# Patient Record
Sex: Male | Born: 1990 | Hispanic: Yes | Marital: Single | Smoking: Never smoker
Health system: Southern US, Community
[De-identification: ages and names within clinical notes are randomized; demographics above are authoritative.]

## PROBLEM LIST (undated history)

## (undated) DIAGNOSIS — S61219A Laceration without foreign body of unspecified finger without damage to nail, initial encounter: Secondary | ICD-10-CM

## (undated) DIAGNOSIS — Z8709 Personal history of other diseases of the respiratory system: Secondary | ICD-10-CM

## (undated) HISTORY — PX: NO PAST SURGERIES: SHX2092

---
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
## 2010-02-16 ENCOUNTER — Emergency Department (HOSPITAL_COMMUNITY): Admission: EM | Admit: 2010-02-16 | Discharge: 2010-02-16 | Payer: Self-pay | Admitting: Emergency Medicine

## 2012-09-30 ENCOUNTER — Encounter (HOSPITAL_COMMUNITY): Payer: Self-pay | Admitting: Emergency Medicine

## 2012-09-30 ENCOUNTER — Emergency Department (HOSPITAL_COMMUNITY)
Admission: EM | Admit: 2012-09-30 | Discharge: 2012-09-30 | Disposition: A | Discharge status: Home / Self Care | Payer: Self-pay | Attending: Emergency Medicine | Admitting: Emergency Medicine

## 2012-09-30 ENCOUNTER — Emergency Department (HOSPITAL_COMMUNITY): Payer: Self-pay

## 2012-09-30 DIAGNOSIS — S62339A Displaced fracture of neck of unspecified metacarpal bone, initial encounter for closed fracture: Secondary | ICD-10-CM

## 2012-09-30 DIAGNOSIS — Y929 Unspecified place or not applicable: Secondary | ICD-10-CM | POA: Insufficient documentation

## 2012-09-30 DIAGNOSIS — IMO0002 Reserved for concepts with insufficient information to code with codable children: Secondary | ICD-10-CM | POA: Insufficient documentation

## 2012-09-30 DIAGNOSIS — Y9389 Activity, other specified: Secondary | ICD-10-CM | POA: Insufficient documentation

## 2012-09-30 DIAGNOSIS — Z79899 Other long term (current) drug therapy: Secondary | ICD-10-CM | POA: Insufficient documentation

## 2012-09-30 DIAGNOSIS — S62309A Unspecified fracture of unspecified metacarpal bone, initial encounter for closed fracture: Secondary | ICD-10-CM | POA: Insufficient documentation

## 2012-09-30 DIAGNOSIS — Z7982 Long term (current) use of aspirin: Secondary | ICD-10-CM | POA: Insufficient documentation

## 2012-09-30 MED ORDER — HYDROCODONE-ACETAMINOPHEN 5-325 MG PO TABS: 1.0000 | ORAL_TABLET | Freq: Four times a day (QID) | ORAL | Status: DC | PRN

## 2012-09-30 NOTE — ED Provider Notes (Signed)
History     CSN: [id number]  Arrival date & time 09/30/12  1121   First MD Initiated Contact with Patient 09/30/12 1144      Chief Complaint  Patient presents with  . Hand Pain    (Consider location/radiation/quality/duration/timing/severity/associated sxs/prior treatment) HPI Comments: Patient presenting with right hand pain.  Developed pain after punching a wall three days ago.  He is currently having some pain and swelling over the 5th metacarpal near the 5th MCP.  No prior injury to the area.    Patient is a 21 y.o. male presenting with hand injury. The history is provided by the patient.  Hand Injury Location:  Hand Hand location:  L hand and R hand Pain details:    Quality:  Aching   Radiates to:  Does not radiate   Timing:  Constant Handedness:  Right-handed Foreign body present:  No foreign bodies Prior injury to area:  No Ineffective treatments:  None tried Associated symptoms: swelling   Associated symptoms: no numbness, no stiffness and no tingling     History reviewed. No pertinent past medical history.  History reviewed. No pertinent past surgical history.  History reviewed. No pertinent family history.  History  Substance Use Topics  . Smoking status: Not on file  . Smokeless tobacco: Not on file  . Alcohol Use: No      Review of Systems  Musculoskeletal: Negative for stiffness.       Right hand pain and swelling  Skin: Negative for color change.  All other systems reviewed and are negative.    Allergies  Shellfish allergy  Home Medications   Current Outpatient Rx  Name  Route  Sig  Dispense  Refill  . albuterol (PROVENTIL HFA;VENTOLIN HFA) 108 (90 BASE) MCG/ACT inhaler   Inhalation   Inhale 2 puffs into the lungs every 6 (six) hours as needed for wheezing or shortness of breath.         . Aspirin-Salicylamide-Caffeine (ARTHRITIS STRENGTH BC POWDER PO)   Oral   Take by mouth.         Marland Kitchen HYDROcodone-acetaminophen (NORCO) 5-325 MG  per tablet   Oral   Take 1 tablet by mouth every 6 (six) hours as needed for pain.   15 tablet   0     BP 140/85  Pulse 61  Temp(Src) 98.6 F (37 C) (Oral)  SpO2 100%  Physical Exam  Nursing note and vitals reviewed. Constitutional: He appears well-developed and well-nourished. No distress.  HENT:  Head: Normocephalic and atraumatic.  Neck: Normal range of motion. Neck supple.  Cardiovascular: Normal rate, regular rhythm and normal heart sounds.   Pulmonary/Chest: Effort normal and breath sounds normal.  Musculoskeletal:       Right wrist: He exhibits normal range of motion, no tenderness, no bony tenderness, no swelling and no deformity.  Tenderness to palpation and swelling over the 5th metacarpal of the right hand.   Patient with flexion and extension of the right 5th digit mildly decreased Full ROM of the 1st-4th fingers of the left hand.    Neurological: He is alert. No sensory deficit.  Skin: Skin is warm and dry. No ecchymosis and no laceration noted. He is not diaphoretic. No erythema.  Good capillary refill of the fingers of the left hand  Psychiatric: He has a normal mood and affect.    ED Course  Procedures (including critical care time)  Labs Reviewed - No data to display Dg Hand Complete Right  09/30/2012  *  RADIOLOGY REPORT*  Clinical Data: Pain and swelling post injury  RIGHT HAND - COMPLETE 3+ VIEW  Comparison: None.  Findings: Three views of the right hand submitted.  Mild displaced fracture of the distal fifth metacarpal.  IMPRESSION: Mild displaced fracture distal fifth metacarpal.   Original Report Authenticated By: Natasha Mead, M.D.      1. Boxer's fracture, closed, initial encounter       MDM  Patient with a mildly displaced closed boxer's fracture.  Patient neurovascularly intact.  Patient given Ulnar gutter splint and referral to Orthopedics.        Pascal Lux [place], PA-C 09/30/12 [phone]

## 2012-09-30 NOTE — ED Notes (Addendum)
Pt states that he got mad on Tuesday and punched a wall.  C/o rt hand pain in his pinky finger.  States that the swelling has gone down but it doesn't feel right.  Denies pain unless he puts his hand down, it throbs.  Pt is able to move all fingers.  Pt made a homemade bandage out of ace wrap and popcicle sticks.

## 2012-10-01 NOTE — ED Provider Notes (Signed)
Medical screening examination/treatment/procedure(s) were performed by non-physician practitioner and as supervising physician I was immediately available for consultation/collaboration.  Christopher J. Pollina, MD 10/01/12 0752 

## 2014-05-29 ENCOUNTER — Emergency Department (HOSPITAL_COMMUNITY): Payer: Self-pay

## 2014-05-29 ENCOUNTER — Encounter (HOSPITAL_COMMUNITY): Payer: Self-pay | Admitting: Emergency Medicine

## 2014-05-29 ENCOUNTER — Emergency Department (HOSPITAL_COMMUNITY)
Admission: EM | Admit: 2014-05-29 | Discharge: 2014-05-30 | Disposition: A | Discharge status: Home / Self Care | Payer: Self-pay | Attending: Emergency Medicine | Admitting: Emergency Medicine

## 2014-05-29 DIAGNOSIS — S66802A Unspecified injury of other specified muscles, fascia and tendons at wrist and hand level, left hand, initial encounter: Secondary | ICD-10-CM

## 2014-05-29 DIAGNOSIS — Z23 Encounter for immunization: Secondary | ICD-10-CM | POA: Insufficient documentation

## 2014-05-29 DIAGNOSIS — Z79899 Other long term (current) drug therapy: Secondary | ICD-10-CM | POA: Insufficient documentation

## 2014-05-29 DIAGNOSIS — S61219A Laceration without foreign body of unspecified finger without damage to nail, initial encounter: Secondary | ICD-10-CM

## 2014-05-29 DIAGNOSIS — Y9289 Other specified places as the place of occurrence of the external cause: Secondary | ICD-10-CM | POA: Insufficient documentation

## 2014-05-29 DIAGNOSIS — Y9389 Activity, other specified: Secondary | ICD-10-CM | POA: Insufficient documentation

## 2014-05-29 DIAGNOSIS — W274XXA Contact with kitchen utensil, initial encounter: Secondary | ICD-10-CM | POA: Insufficient documentation

## 2014-05-29 DIAGNOSIS — S66103A Unspecified injury of flexor muscle, fascia and tendon of left middle finger at wrist and hand level, initial encounter: Secondary | ICD-10-CM | POA: Insufficient documentation

## 2014-05-29 DIAGNOSIS — S61213A Laceration without foreign body of left middle finger without damage to nail, initial encounter: Secondary | ICD-10-CM | POA: Insufficient documentation

## 2014-05-29 MED ORDER — TRAMADOL HCL 50 MG PO TABS
50.0000 mg | ORAL_TABLET | Freq: Once | ORAL | Status: AC
  Administered 2014-05-30: 50 mg via ORAL
  Filled 2014-05-29: qty 1

## 2014-05-29 MED ORDER — TETANUS-DIPHTH-ACELL PERTUSSIS 5-2.5-18.5 LF-MCG/0.5 IM SUSP
0.5000 mL | Freq: Once | INTRAMUSCULAR | Status: AC
  Administered 2014-05-30: 0.5 mL via INTRAMUSCULAR
  Filled 2014-05-29: qty 0.5

## 2014-05-29 NOTE — ED Notes (Signed)
Pt presents with laceration to anterior L middle finger, pt states laceration occurred while washing dishes 30 min ago. Not bleeding at this time.

## 2014-05-29 NOTE — ED Provider Notes (Signed)
CSN: [id number]     Arrival date & time 05/29/14  2316 History  This chart was scribed for non-physician practitioner, Antony MaduraKelly Azarya Oconnell, PA-C working with Dione Boozeavid Glick, MD by Luisa DagoPriscilla Tutu, ED scribe. This patient was seen in room [facility] and the patient's care was started at 11:36 PM.     Chief Complaint  Patient presents with  . Laceration   Patient is a 23 y.o. male presenting with skin laceration.  Laceration Location:  Finger Finger laceration location:  L middle finger Depth:  Through muscle Bleeding: controlled   Time since incident:  30 minutes Laceration mechanism:  Blunt object Pain details:    Quality:  Aching   Severity:  Moderate   Timing:  Constant   Progression:  Unchanged Foreign body present:  No foreign bodies Relieved by:  Nothing Worsened by:  Movement Ineffective treatments:  None tried Tetanus status:  Unknown  HPI Comments: Eric [name] is a 23 y.o. male who presents to the Emergency Department complaining of laceration to anterior left middle finger that occurred just PTA. Pt states that he was washing dishes when he felt a sharp cutting sensation to the affected finger so he took his hand out of the water. Mr. Eric [name] states that he is unable to move his finger. Bleeding is controlled. Pt is right hand dominant. He does not recall the date of his last tetanus vaccine.   History reviewed. No pertinent past medical history. History reviewed. No pertinent past surgical history. No family history on file. History  Substance Use Topics  . Smoking status: Never Smoker   . Smokeless tobacco: Not on file  . Alcohol Use: No    Review of Systems  Skin: Positive for wound.  Neurological: Negative for weakness.  All other systems reviewed and are negative.   Allergies  Shellfish allergy  Home Medications   Prior to Admission medications   Medication Sig Start Date End Date Taking? Authorizing Provider  ibuprofen (ADVIL,MOTRIN) 200 MG tablet Take 600  mg by mouth every 6 (six) hours as needed for moderate pain.   Yes Historical Provider, MD  albuterol (PROVENTIL HFA;VENTOLIN HFA) 108 (90 BASE) MCG/ACT inhaler Inhale 2 puffs into the lungs every 6 (six) hours as needed for wheezing or shortness of breath.    Historical Provider, MD  cephALEXin (KEFLEX) 500 MG capsule Take 1 capsule (500 mg total) by mouth 4 (four) times daily. 05/30/14   Antony MaduraKelly Caliyah Sieh, PA-C  ibuprofen (ADVIL,MOTRIN) 600 MG tablet Take 1 tablet (600 mg total) by mouth every 6 (six) hours as needed. 05/30/14   Antony MaduraKelly Shameca Landen, PA-C   Triage Vitals:BP 121/66  Pulse 91  Resp 20  Ht 5\' 10"  (1.778 m)  Wt 135 lb (61.236 kg)  BMI 19.37 kg/m2  SpO2 100%  Physical Exam  Nursing note and vitals reviewed. Constitutional: He is oriented to person, place, and time. He appears well-developed and well-nourished. No distress.  HENT:  Head: Normocephalic and atraumatic.  Eyes: Conjunctivae and EOM are normal. No scleral icterus.  Neck: Normal range of motion.  Cardiovascular: Normal rate, regular rhythm and intact distal pulses.   Distal radial pulse 2+ in LUE. Capillary refill brisk in all digits of L hand.  Pulmonary/Chest: Effort normal. No respiratory distress.  Musculoskeletal: He exhibits tenderness.       Left hand: He exhibits decreased range of motion, tenderness and laceration. He exhibits no bony tenderness, normal capillary refill and no deformity. Normal sensation noted.       Hands:  1.5 cm laceration to palmar aspect of L 3rd finger just distal to PIP joint. Patient unable to flex at DIP of affected finger c/w likely FDP laceration. FDS appreciated to be 5/5 against resistance. Finger extension intact.   Neurological: He is alert and oriented to person, place, and time. He exhibits normal muscle tone. Coordination normal.  Sensation to light touch intact.  Skin: Skin is warm and dry. No rash noted. He is not diaphoretic. No erythema. No pallor.  Psychiatric: He has a normal  mood and affect. His behavior is normal.    ED Course  Procedures (including critical care time)  DIAGNOSTIC STUDIES: Oxygen Saturation is 100% on RA, normal by my interpretation.    COORDINATION OF CARE: 3:40 AM- Pt advised of plan for treatment and pt agrees.  Labs Review Labs Reviewed - No data to display  Imaging Review Dg Finger Middle Left  05/30/2014   CLINICAL DATA:  Laceration to the palm are side of the left middle finger at the PIP. Cut while doing dishes.  EXAM: LEFT MIDDLE FINGER 2+V  COMPARISON:  None.  FINDINGS: No evidence of acute fracture or dislocation of the left third finger. No focal bone lesions or bone destruction. No radiopaque soft tissue foreign bodies.  IMPRESSION: Negative.   Electronically Signed   By: Burman NievesWilliam  Stevens M.D.   On: 05/30/2014 00:00    EKG Interpretation None     LACERATION REPAIR Performed by: Antony MaduraHUMES, Cabrina Shiroma Authorized by: Antony MaduraHUMES, Sully Manzi Consent: Verbal consent obtained. Risks and benefits: risks, benefits and alternatives were discussed Consent given by: patient Patient identity confirmed: provided demographic data Prepped and Draped in normal sterile fashion Wound explored  Laceration Location: L middle finger  Laceration Length: 1.5cm  No Foreign Bodies seen or palpated  Anesthesia: local infiltration  Local anesthetic: lidocaine 1% without epinephrine  Anesthetic total: 1.5 ml  Irrigation method: syringe Amount of cleaning: standard  Skin closure: 5-0 prolene  Number of sutures: 3  Technique: simple interrupted  Patient tolerance: Patient tolerated the procedure well with no immediate complications.  MDM   Final diagnoses:  Laceration of finger, left, with tendon, initial encounter  Injury of flexor tendon of hand, left, initial encounter    23 year old male presents to the emergency department for further evaluation of finger laceration of palmar aspect of left third finger. Patient neurovascularly intact,  but unable to flex at his DIP joint. Imaging today shows no evidence of fracture. Physical exam suggestive of laceration of FDP tendon of L 3rd finger. Wound closed in ED and patient placed in finger splint. Case discussed with Dr. Mina MarbleWeingold who will followup with the patient in the office for repair. Patient told to call the office in the morning to schedule followup appointment. Will initiate Keflex to cover for infection. Return precautions discussed and provided. Patient agreeable to plan with no unaddressed concerns.  I personally performed the services described in this documentation, which was scribed in my presence. The recorded information has been reviewed and is accurate.   Filed Vitals:   05/29/14 2322 05/30/14 0216  BP: 121/66 115/61  Pulse: 91 61  Resp: 20 16  Height: 5\' 10"  (1.778 m)   Weight: 161135 lb (61.236 kg)   SpO2: 100% 100%       Antony MaduraKelly Starla Deller, PA-C 05/30/14 [phone]

## 2014-05-30 ENCOUNTER — Encounter (HOSPITAL_BASED_OUTPATIENT_CLINIC_OR_DEPARTMENT_OTHER): Payer: Self-pay | Admitting: *Deleted

## 2014-05-30 ENCOUNTER — Other Ambulatory Visit: Payer: Self-pay | Admitting: Orthopedic Surgery

## 2014-05-30 DIAGNOSIS — S61219A Laceration without foreign body of unspecified finger without damage to nail, initial encounter: Secondary | ICD-10-CM

## 2014-05-30 HISTORY — DX: Laceration without foreign body of unspecified finger without damage to nail, initial encounter: S61.219A

## 2014-05-30 MED ORDER — IBUPROFEN 600 MG PO TABS: 600.0000 mg | ORAL_TABLET | Freq: Four times a day (QID) | ORAL | Status: DC | PRN

## 2014-05-30 MED ORDER — LIDOCAINE HCL 1 % IJ SOLN
5.0000 mL | Freq: Once | INTRAMUSCULAR | Status: AC
  Administered 2014-05-30: 5 mL
  Filled 2014-05-30: qty 20

## 2014-05-30 MED ORDER — CEPHALEXIN 500 MG PO CAPS
500.0000 mg | ORAL_CAPSULE | Freq: Four times a day (QID) | ORAL | Status: DC
Start: 2014-05-30 — End: 2014-05-30

## 2014-05-30 NOTE — ED Provider Notes (Signed)
Medical screening examination/treatment/procedure(s) were performed by non-physician practitioner and as supervising physician I was immediately available for consultation/collaboration.   Saadiq Poche, MD 05/30/14 0815 

## 2014-05-30 NOTE — Discharge Instructions (Signed)
Call the hand specialist in the morning to schedule follow up as a tendon in your finger is likely cut from your injury. Wear a finger splint until further instructed by a hand specialist. Take Keflex to prevent infection and ibuprofen for pain as needed.  Laceration Care, Adult A laceration is a cut or lesion that goes through all layers of the skin and into the tissue just beneath the skin. TREATMENT  Some lacerations may not require closure. Some lacerations may not be able to be closed due to an increased risk of infection. It is important to see your caregiver as soon as possible after an injury to minimize the risk of infection and maximize the opportunity for successful closure. If closure is appropriate, pain medicines may be given, if needed. The wound will be cleaned to help prevent infection. Your caregiver will use stitches (sutures), staples, wound glue (adhesive), or skin adhesive strips to repair the laceration. These tools bring the skin edges together to allow for faster healing and a better cosmetic outcome. However, all wounds will heal with a scar. Once the wound has healed, scarring can be minimized by covering the wound with sunscreen during the day for 1 full year. HOME CARE INSTRUCTIONS  For sutures or staples:  Keep the wound clean and dry.  If you were given a bandage (dressing), you should change it at least once a day. Also, change the dressing if it becomes wet or dirty, or as directed by your caregiver.  Wash the wound with soap and water 2 times a day. Rinse the wound off with water to remove all soap. Pat the wound dry with a clean towel.  After cleaning, apply a thin layer of the antibiotic ointment as recommended by your caregiver. This will help prevent infection and keep the dressing from sticking.  You may shower as usual after the first 24 hours. Do not soak the wound in water until the sutures are removed.  Only take over-the-counter or prescription  medicines for pain, discomfort, or fever as directed by your caregiver.  Get your sutures or staples removed as directed by your caregiver. For skin adhesive strips:  Keep the wound clean and dry.  Do not get the skin adhesive strips wet. You may bathe carefully, using caution to keep the wound dry.  If the wound gets wet, pat it dry with a clean towel.  Skin adhesive strips will fall off on their own. You may trim the strips as the wound heals. Do not remove skin adhesive strips that are still stuck to the wound. They will fall off in time. For wound adhesive:  You may briefly wet your wound in the shower or bath. Do not soak or scrub the wound. Do not swim. Avoid periods of heavy perspiration until the skin adhesive has fallen off on its own. After showering or bathing, gently pat the wound dry with a clean towel.  Do not apply liquid medicine, cream medicine, or ointment medicine to your wound while the skin adhesive is in place. This may loosen the film before your wound is healed.  If a dressing is placed over the wound, be careful not to apply tape directly over the skin adhesive. This may cause the adhesive to be pulled off before the wound is healed.  Avoid prolonged exposure to sunlight or tanning lamps while the skin adhesive is in place. Exposure to ultraviolet light in the first year will darken the scar.  The skin adhesive will usually  remain in place for 5 to 10 days, then naturally fall off the skin. Do not pick at the adhesive film. °You may need a tetanus shot if: °· You cannot remember when you had your last tetanus shot. °· You have never had a tetanus shot. °If you get a tetanus shot, your arm may swell, get red, and feel warm to the touch. This is common and not a problem. If you need a tetanus shot and you choose not to have one, there is a rare chance of getting tetanus. Sickness from tetanus can be serious. °SEEK MEDICAL CARE IF:  °· You have redness, swelling, or  increasing pain in the wound. °· You see a red line that goes away from the wound. °· You have yellowish-white fluid (pus) coming from the wound. °· You have a fever. °· You notice a bad smell coming from the wound or dressing. °· Your wound breaks open before or after sutures have been removed. °· You notice something coming out of the wound such as wood or glass. °· Your wound is on your hand or foot and you cannot move a finger or toe. °SEEK IMMEDIATE MEDICAL CARE IF:  °· Your pain is not controlled with prescribed medicine. °· You have severe swelling around the wound causing pain and numbness or a change in color in your arm, hand, leg, or foot. °· Your wound splits open and starts bleeding. °· You have worsening numbness, weakness, or loss of function of any joint around or beyond the wound. °· You develop painful lumps near the wound or on the skin anywhere on your body. °MAKE SURE YOU:  °· Understand these instructions. °· Will watch your condition. °· Will get help right away if you are not doing well or get worse. °Document Released: 07/26/2005 Document Revised: 10/18/2011 Document Reviewed: 01/19/2011 °Exit[employer]® Patient Information ©2015 [employer]. This information is not intended to replace advice given to you by your health care provider. Make sure you discuss any questions you have with your health care provider. ° °

## 2014-05-31 ENCOUNTER — Encounter (HOSPITAL_BASED_OUTPATIENT_CLINIC_OR_DEPARTMENT_OTHER): Payer: Self-pay | Admitting: Anesthesiology

## 2014-05-31 ENCOUNTER — Ambulatory Visit (HOSPITAL_BASED_OUTPATIENT_CLINIC_OR_DEPARTMENT_OTHER)
Admission: RE | Admit: 2014-05-31 | Discharge: 2014-05-31 | Disposition: A | Discharge status: Home / Self Care | Payer: Self-pay | Source: Ambulatory Visit | Attending: Orthopedic Surgery | Admitting: Orthopedic Surgery

## 2014-05-31 ENCOUNTER — Ambulatory Visit (HOSPITAL_BASED_OUTPATIENT_CLINIC_OR_DEPARTMENT_OTHER): Payer: Self-pay | Admitting: Anesthesiology

## 2014-05-31 ENCOUNTER — Encounter (HOSPITAL_BASED_OUTPATIENT_CLINIC_OR_DEPARTMENT_OTHER)
Admission: RE | Disposition: A | Discharge status: Home / Self Care | Payer: Self-pay | Source: Ambulatory Visit | Attending: Orthopedic Surgery

## 2014-05-31 DIAGNOSIS — Z91013 Allergy to seafood: Secondary | ICD-10-CM | POA: Insufficient documentation

## 2014-05-31 DIAGNOSIS — S66822D Laceration of other specified muscles, fascia and tendons at wrist and hand level, left hand, subsequent encounter: Secondary | ICD-10-CM

## 2014-05-31 DIAGNOSIS — Y998 Other external cause status: Secondary | ICD-10-CM | POA: Insufficient documentation

## 2014-05-31 DIAGNOSIS — Y93G1 Activity, food preparation and clean up: Secondary | ICD-10-CM | POA: Insufficient documentation

## 2014-05-31 DIAGNOSIS — W25XXXA Contact with sharp glass, initial encounter: Secondary | ICD-10-CM | POA: Insufficient documentation

## 2014-05-31 DIAGNOSIS — S66123A Laceration of flexor muscle, fascia and tendon of left middle finger at wrist and hand level, initial encounter: Secondary | ICD-10-CM | POA: Insufficient documentation

## 2014-05-31 DIAGNOSIS — S61402D Unspecified open wound of left hand, subsequent encounter: Secondary | ICD-10-CM

## 2014-05-31 DIAGNOSIS — Y92090 Kitchen in other non-institutional residence as the place of occurrence of the external cause: Secondary | ICD-10-CM | POA: Insufficient documentation

## 2014-05-31 HISTORY — PX: ARTERY EXPLORATION: SHX5110

## 2014-05-31 HISTORY — DX: Personal history of other diseases of the respiratory system: Z87.09

## 2014-05-31 HISTORY — DX: Laceration without foreign body of unspecified finger without damage to nail, initial encounter: S61.219A

## 2014-05-31 HISTORY — PX: TENDON REPAIR: SHX5111

## 2014-05-31 HISTORY — PX: NERVE EXPLORATION: SHX2082

## 2014-05-31 LAB — POCT HEMOGLOBIN-HEMACUE: Hemoglobin: 12.5 g/dL — ABNORMAL LOW (ref 13.0–17.0)

## 2014-05-31 SURGERY — TENDON REPAIR
Anesthesia: General | Site: Finger | Laterality: Left

## 2014-05-31 MED ORDER — ONDANSETRON HCL 4 MG/2ML IJ SOLN: 4.0000 mg | Freq: Once | INTRAMUSCULAR | Status: DC | PRN

## 2014-05-31 MED ORDER — FENTANYL CITRATE 0.05 MG/ML IJ SOLN: 50.0000 ug | INTRAMUSCULAR | Status: DC | PRN

## 2014-05-31 MED ORDER — OXYCODONE HCL 5 MG PO TABS: 5.0000 mg | ORAL_TABLET | Freq: Once | ORAL | Status: DC | PRN

## 2014-05-31 MED ORDER — DEXAMETHASONE SODIUM PHOSPHATE 10 MG/ML IJ SOLN
INTRAMUSCULAR | Status: DC | PRN
  Administered 2014-05-31: 10 mg via INTRAVENOUS

## 2014-05-31 MED ORDER — CEFAZOLIN SODIUM-DEXTROSE 2-3 GM-% IV SOLR
INTRAVENOUS | Status: AC
  Filled 2014-05-31: qty 50

## 2014-05-31 MED ORDER — CHLORHEXIDINE GLUCONATE 4 % EX LIQD: 60.0000 mL | Freq: Once | CUTANEOUS | Status: DC

## 2014-05-31 MED ORDER — LIDOCAINE HCL 2 % IJ SOLN
INTRAMUSCULAR | Status: AC
  Filled 2014-05-31: qty 20

## 2014-05-31 MED ORDER — CEFAZOLIN SODIUM-DEXTROSE 2-3 GM-% IV SOLR: 2.0000 g | INTRAVENOUS | Status: DC

## 2014-05-31 MED ORDER — OXYCODONE HCL 5 MG/5ML PO SOLN: 5.0000 mg | Freq: Once | ORAL | Status: DC | PRN

## 2014-05-31 MED ORDER — LIDOCAINE HCL (CARDIAC) 20 MG/ML IV SOLN
INTRAVENOUS | Status: DC | PRN
  Administered 2014-05-31: 50 mg via INTRAVENOUS

## 2014-05-31 MED ORDER — MIDAZOLAM HCL 2 MG/2ML IJ SOLN
INTRAMUSCULAR | Status: AC
  Filled 2014-05-31: qty 2

## 2014-05-31 MED ORDER — FENTANYL CITRATE 0.05 MG/ML IJ SOLN
INTRAMUSCULAR | Status: DC | PRN
  Administered 2014-05-31 (×2): 50 ug via INTRAVENOUS

## 2014-05-31 MED ORDER — BUPIVACAINE HCL 0.25 % IJ SOLN
INTRAMUSCULAR | Status: DC | PRN
  Administered 2014-05-31: 9 mL

## 2014-05-31 MED ORDER — BUPIVACAINE HCL (PF) 0.25 % IJ SOLN
INTRAMUSCULAR | Status: AC
  Filled 2014-05-31: qty 30

## 2014-05-31 MED ORDER — OXYCODONE-ACETAMINOPHEN 5-325 MG PO TABS: 1.0000 | ORAL_TABLET | ORAL | Status: AC | PRN

## 2014-05-31 MED ORDER — MEPERIDINE HCL 25 MG/ML IJ SOLN: 6.2500 mg | INTRAMUSCULAR | Status: DC | PRN

## 2014-05-31 MED ORDER — HEPARIN SODIUM (PORCINE) 1000 UNIT/ML IJ SOLN
INTRAMUSCULAR | Status: AC
  Filled 2014-05-31: qty 1

## 2014-05-31 MED ORDER — MIDAZOLAM HCL 2 MG/ML PO SYRP: 12.0000 mg | ORAL_SOLUTION | Freq: Once | ORAL | Status: DC | PRN

## 2014-05-31 MED ORDER — HYDROMORPHONE HCL 1 MG/ML IJ SOLN: 0.2500 mg | INTRAMUSCULAR | Status: DC | PRN

## 2014-05-31 MED ORDER — FENTANYL CITRATE 0.05 MG/ML IJ SOLN
INTRAMUSCULAR | Status: AC
  Filled 2014-05-31: qty 6

## 2014-05-31 MED ORDER — PROPOFOL 10 MG/ML IV BOLUS
INTRAVENOUS | Status: DC | PRN
  Administered 2014-05-31: 150 mg via INTRAVENOUS

## 2014-05-31 MED ORDER — CEFAZOLIN SODIUM-DEXTROSE 2-3 GM-% IV SOLR
INTRAVENOUS | Status: DC | PRN
  Administered 2014-05-31: 2 g via INTRAVENOUS

## 2014-05-31 MED ORDER — MIDAZOLAM HCL 5 MG/5ML IJ SOLN
INTRAMUSCULAR | Status: DC | PRN
Start: 2014-05-31 — End: 2014-05-31
  Administered 2014-05-31: 2 mg via INTRAVENOUS

## 2014-05-31 MED ORDER — LACTATED RINGERS IV SOLN
INTRAVENOUS | Status: DC
  Administered 2014-05-31 (×2): via INTRAVENOUS

## 2014-05-31 MED ORDER — LIDOCAINE HCL (PF) 1 % IJ SOLN
INTRAMUSCULAR | Status: AC
  Filled 2014-05-31: qty 30

## 2014-05-31 MED ORDER — MIDAZOLAM HCL 2 MG/2ML IJ SOLN: 1.0000 mg | INTRAMUSCULAR | Status: DC | PRN

## 2014-05-31 SURGICAL SUPPLY — 69 items
APL SKNCLS STERI-STRIP NONHPOA (GAUZE/BANDAGES/DRESSINGS)
BAG DECANTER FOR FLEXI CONT (MISCELLANEOUS) IMPLANT
BANDAGE ELASTIC 4 VELCRO ST LF (GAUZE/BANDAGES/DRESSINGS) IMPLANT
BENZOIN TINCTURE PRP APPL 2/3 (GAUZE/BANDAGES/DRESSINGS) IMPLANT
BLADE MINI RND TIP GREEN BEAV (BLADE) IMPLANT
BLADE SURG 15 STRL LF DISP TIS (BLADE) ×1 IMPLANT
BLADE SURG 15 STRL SS (BLADE) ×3
BNDG CMPR 9X4 STRL LF SNTH (GAUZE/BANDAGES/DRESSINGS) ×1
BNDG CMPR MD 5X2 ELC HKLP STRL (GAUZE/BANDAGES/DRESSINGS) ×1
BNDG COHESIVE 1X5 TAN STRL LF (GAUZE/BANDAGES/DRESSINGS) IMPLANT
BNDG ELASTIC 2 VLCR STRL LF (GAUZE/BANDAGES/DRESSINGS) ×3 IMPLANT
BNDG ESMARK 4X9 LF (GAUZE/BANDAGES/DRESSINGS) ×3 IMPLANT
BNDG GAUZE ELAST 4 BULKY (GAUZE/BANDAGES/DRESSINGS) ×3 IMPLANT
CHLORAPREP W/TINT 26ML (MISCELLANEOUS) ×3 IMPLANT
CLOSURE WOUND 1/2 X4 (GAUZE/BANDAGES/DRESSINGS)
CORDS BIPOLAR (ELECTRODE) ×3 IMPLANT
COVER BACK TABLE 60X90IN (DRAPES) ×3 IMPLANT
CUFF TOURNIQUET SINGLE 18IN (TOURNIQUET CUFF) ×3 IMPLANT
DECANTER SPIKE VIAL GLASS SM (MISCELLANEOUS) IMPLANT
DRAPE EXTREMITY TIBURON (DRAPES) ×3 IMPLANT
DRAPE SURG 17X23 STRL (DRAPES) ×3 IMPLANT
DURAPREP 26ML APPLICATOR (WOUND CARE) IMPLANT
GAUZE SPONGE 4X4 12PLY STRL (GAUZE/BANDAGES/DRESSINGS) ×3 IMPLANT
GAUZE SPONGE 4X4 16PLY XRAY LF (GAUZE/BANDAGES/DRESSINGS) IMPLANT
GAUZE XEROFORM 1X8 LF (GAUZE/BANDAGES/DRESSINGS) ×3 IMPLANT
GLOVE BIOGEL M STRL SZ7.5 (GLOVE) ×3 IMPLANT
GLOVE BIOGEL PI IND STRL 7.0 (GLOVE) ×1 IMPLANT
GLOVE BIOGEL PI INDICATOR 7.0 (GLOVE) ×2
GLOVE ECLIPSE 6.5 STRL STRAW (GLOVE) ×3 IMPLANT
GLOVE SURG SYN 8.0 (GLOVE) ×3 IMPLANT
GOWN STRL REUS W/ TWL LRG LVL3 (GOWN DISPOSABLE) ×1 IMPLANT
GOWN STRL REUS W/TWL LRG LVL3 (GOWN DISPOSABLE) ×3
GOWN STRL REUS W/TWL XL LVL3 (GOWN DISPOSABLE) ×6 IMPLANT
IV LACTATED RINGERS 500ML (IV SOLUTION) IMPLANT
NEEDLE HYPO 25X1 1.5 SAFETY (NEEDLE) ×3 IMPLANT
NS IRRIG 1000ML POUR BTL (IV SOLUTION) ×3 IMPLANT
PACK BASIN DAY SURGERY FS (CUSTOM PROCEDURE TRAY) ×3 IMPLANT
PAD CAST 3X4 CTTN HI CHSV (CAST SUPPLIES) ×1 IMPLANT
PADDING CAST ABS 4INX4YD NS (CAST SUPPLIES) ×2
PADDING CAST ABS COTTON 4X4 ST (CAST SUPPLIES) ×1 IMPLANT
PADDING CAST COTTON 3X4 STRL (CAST SUPPLIES) ×3
PADDING UNDERCAST 2 STRL (CAST SUPPLIES)
PADDING UNDERCAST 2X4 STRL (CAST SUPPLIES) IMPLANT
PASSER SUT SWANSON 36MM LOOP (INSTRUMENTS) IMPLANT
SHEET MEDIUM DRAPE 40X70 STRL (DRAPES) ×3 IMPLANT
SLEEVE SCD COMPRESS KNEE MED (MISCELLANEOUS) ×3 IMPLANT
SPEAR EYE SURG WECK-CEL (MISCELLANEOUS) IMPLANT
SPLINT PLASTER CAST XFAST 4X15 (CAST SUPPLIES) ×14 IMPLANT
SPLINT PLASTER XTRA FAST SET 4 (CAST SUPPLIES) ×28
STOCKINETTE 4X48 STRL (DRAPES) ×3 IMPLANT
STRIP CLOSURE SKIN 1/2X4 (GAUZE/BANDAGES/DRESSINGS) IMPLANT
SUT ETHIBOND 3-0 V-5 (SUTURE) ×6 IMPLANT
SUT ETHILON 4 0 PS 2 18 (SUTURE) ×3 IMPLANT
SUT ETHILON 5 0 PS 2 18 (SUTURE) IMPLANT
SUT FIBERWIRE 3-0 18 TAPR NDL (SUTURE)
SUT FIBERWIRE 4-0 18 TAPR NDL (SUTURE)
SUT MERSILENE 4 0 P 3 (SUTURE) ×3 IMPLANT
SUT NYLON 9 0 VRM6 (SUTURE) IMPLANT
SUT PROLENE 3 0 PS 2 (SUTURE) IMPLANT
SUT PROLENE 6 0 P 1 18 (SUTURE) ×3 IMPLANT
SUT VICRYL RAPIDE 4-0 (SUTURE) IMPLANT
SUT VICRYL RAPIDE 4/0 PS 2 (SUTURE) IMPLANT
SUTURE FIBERWR 3-0 18 TAPR NDL (SUTURE) IMPLANT
SUTURE FIBERWR 4-0 18 TAPR NDL (SUTURE) IMPLANT
SYR BULB 3OZ (MISCELLANEOUS) ×3 IMPLANT
SYRINGE 10CC LL (SYRINGE) IMPLANT
TOWEL OR 17X24 6PK STRL BLUE (TOWEL DISPOSABLE) ×3 IMPLANT
TUBE FEEDING 5FR 15 INCH (TUBING) IMPLANT
UNDERPAD 30X30 INCONTINENT (UNDERPADS AND DIAPERS) ×3 IMPLANT

## 2014-05-31 NOTE — Op Note (Signed)
NAMArlice Colt:  [name], Eric [name]              ACCOUNT NO.:  [id number]  MEDICAL RECORD NO.:  [id number]  LOCATION:  [facility]                         [facility]  PHYSICIAN:  Artist PaisMatthew A. Seraphina Mitchner, M.D.DATE OF BIRTH:  04/13/1991  DATE OF PROCEDURE:  05/31/2014 DATE OF DISCHARGE:  05/30/2014                              OPERATIVE REPORT   PREOPERATIVE DIAGNOSIS:  Left long finger zone 2 flexor digitorum profundus laceration.  POSTOPERATIVE DIAGNOSIS:  Left long finger zone 2 flexor digitorum profundus laceration.  PROCEDURE:  Primary repair of above.  SURGEON:  Artist PaisMatthew A. Mina MarbleWeingold, M.D.  ASSISTANT:  Jonni Sangerobert J. Dasnoit, P.A.  ANESTHESIA:  General.  COMPLICATIONS:  No complications.  DRAINS:  No drains.  DESCRIPTION OF PROCEDURE:  The patient was taken to the operating suite. After induction of adequate general anesthetic, left upper extremity was prepped and draped in sterile fashion.  An Esmarch was used to exsanguinate the limb.  Tourniquet was inflated to 250 mmHg.  At this point in time, a laceration of the proximal interphalangeal joint flexion crease extended proximally ulnar and distal radial.  Dissection was carried down in the interval between the A4 and A2 pulleys.  There was a laceration in the sheath and a complete laceration of profundus tendon and 70% laceration of both slips of the superficialis tendon. The wound was irrigated.  The superficialis tendons were repaired with 4- 0 Mersilene in horizontal mattress sutures followed by 6-0 Prolene epitendinous-type stitches.  We then carefully retrieved the proximal stump of the profundus tendon back into the distal wound, held it with a 25-gauge needle.  We used a 3-0 double-armed Ethibond __________ suture to the proximal edge of the tendon as well as the distal stump, which was easily brought into the operative field.  Repair was done primarily under no tension.  We then placed a 6-0 Prolene epitendinous-type stitch to  complete the repair. The wound was irrigated and loosely closed with 4-0 nylon.  Xeroform, 4x4s, and compressive wrap was applied as well as the dorsal extensor block splint.  The patient tolerated the procedure well and went to the recovery room in a stable fashion.     Artist PaisMatthew A. Mina MarbleWeingold, M.D.     MAW/MEDQ  D:  05/31/2014  T:  05/31/2014  Job:  [id number]

## 2014-05-31 NOTE — Transfer of Care (Signed)
Immediate Anesthesia Transfer of Care Note  Patient: Eric [name]  Procedure(s) Performed: Procedure(s): LEFT LONG FINGER EXPLORE/NERVE, TENDON AND ARTERY REPAIR (Left)  Patient Location: PACU  Anesthesia Type:General  Level of Consciousness: awake and patient cooperative  Airway & Oxygen Therapy: Patient Spontanous Breathing and Patient connected to face mask oxygen  Post-op Assessment: Report given to PACU RN and Post -op Vital signs reviewed and stable  Post vital signs: Reviewed and stable  Complications: No apparent anesthesia complications

## 2014-05-31 NOTE — Op Note (Signed)
See note [id number]

## 2014-05-31 NOTE — Anesthesia Preprocedure Evaluation (Signed)

## 2014-05-31 NOTE — H&P (Signed)
Adonis HousekeeperJason Sheeler is an 23 y.o. male.   Chief Complaint: left long volar laceration HPI: as above with loss of active flexion and sensation  Past Medical History  Diagnosis Date  . Finger laceration involving tendon 05/30/2014    left long finger  . History of asthma     as a child    Past Surgical History  Procedure Laterality Date  . No past surgeries      History reviewed. No pertinent family history. Social History:  reports that he has never smoked. He has never used smokeless tobacco. He reports that he does not drink alcohol or use illicit drugs.  Allergies:  Allergies  Allergen Reactions  . Shellfish Allergy Shortness Of Breath    No prescriptions prior to admission    Results for orders placed during the hospital encounter of 05/31/14 (from the past 48 hour(s))  POCT HEMOGLOBIN-HEMACUE     Status: Abnormal   Collection Time    05/31/14 10:21 AM      Result Value Ref Range   Hemoglobin 12.5 (*) 13.0 - 17.0 g/dL   Dg Finger Middle Left  05/30/2014   CLINICAL DATA:  Laceration to the palm are side of the left middle finger at the PIP. Cut while doing dishes.  EXAM: LEFT MIDDLE FINGER 2+V  COMPARISON:  None.  FINDINGS: No evidence of acute fracture or dislocation of the left third finger. No focal bone lesions or bone destruction. No radiopaque soft tissue foreign bodies.  IMPRESSION: Negative.   Electronically Signed   By: Burman NievesWilliam  Stevens M.D.   On: 05/30/2014 00:00    Review of Systems  All other systems reviewed and are negative.   Blood pressure 110/65, pulse 48, temperature 98.3 F (36.8 C), temperature source Oral, resp. rate 20, height 5\' 10"  (1.778 m), weight 60.328 kg (133 lb), SpO2 100.00%. Physical Exam  Constitutional: He is oriented to person, place, and time. He appears well-developed and well-nourished.  HENT:  Head: Normocephalic and atraumatic.  Cardiovascular: Normal rate.   Respiratory: Effort normal.  Musculoskeletal:       Left hand: He  exhibits tenderness, disruption of two-point discrimination and laceration.  Left long volar laceration with loss of flexion and sensation  Neurological: He is alert and oriented to person, place, and time.  Skin: Skin is warm.  Psychiatric: He has a normal mood and affect. His behavior is normal. Judgment and thought content normal.     Assessment/Plan As above   Plan explore and repair as needed  Bryndan Bilyk A 05/31/2014, 11:40 AM

## 2014-05-31 NOTE — Discharge Instructions (Signed)

## 2014-05-31 NOTE — Anesthesia Postprocedure Evaluation (Signed)
Anesthesia Post Note  Patient: Eric HousekeeperJason [name]  Procedure(s) Performed: Procedure(s) (LRB): LEFT LONG FINGER EXPLORE/NERVE, TENDON AND ARTERY REPAIR AND REPAIR TENDON (Left)  Anesthesia type: general  Patient location: PACU  Post pain: Pain level controlled  Post assessment: Patient's Cardiovascular Status Stable  Last Vitals:  Filed Vitals:   05/31/14 1406  BP: 102/60  Pulse: 50  Temp: 36.8 C  Resp: 16    Post vital signs: Reviewed and stable  Level of consciousness: sedated  Complications: No apparent anesthesia complications

## 2014-05-31 NOTE — Anesthesia Procedure Notes (Signed)
Procedure Name: LMA Insertion Date/Time: 05/31/2014 11:57 AM Performed by: Genevieve NorlanderLINKA, Tiegan Jambor L Pre-anesthesia Checklist: Patient identified, Emergency Drugs available, Suction available, Patient being monitored and Timeout performed Patient Re-evaluated:Patient Re-evaluated prior to inductionOxygen Delivery Method: Circle System Utilized Preoxygenation: Pre-oxygenation with 100% oxygen Intubation Type: IV induction Ventilation: Mask ventilation without difficulty LMA: LMA inserted LMA Size: 5.0 Number of attempts: 1 Airway Equipment and Method: bite block Placement Confirmation: positive ETCO2 Tube secured with: Tape Dental Injury: Teeth and Oropharynx as per pre-operative assessment

## 2014-06-03 NOTE — Op Note (Signed)
NAMArlice [name]:  [name], Eric              ACCOUNT NO.:  [id number]  MEDICAL RECORD NO.:  [id number]  LOCATION:                                 FACILITY:  PHYSICIAN:  Artist PaisMatthew A. Facundo Allemand, M.D.DATE OF BIRTH:  [birth year]  DATE OF PROCEDURE:  05/31/2014 DATE OF DISCHARGE:  05/31/2014                              OPERATIVE REPORT   PREOPERATIVE DIAGNOSES:  Left long finger flexor digitorum profundus and superficialis zone II lacerations.  POSTOPERATIVE DIAGNOSES:  Left long finger flexor digitorum profundus and superficialis zone II lacerations.  PROCEDURE:  Primary repair of left long finger flexor digitorum profundus and superficialis tendon.  SURGEON:  Artist PaisMatthew A. Mina MarbleWeingold, M.D.  ASSISTANT:  Jonni Sangerobert J. Dasnoit, P.A.  ANESTHESIA:  General.  COMPLICATIONS:  No complication.  DRAINS:  No drains.  DESCRIPTION OF PROCEDURE:  The patient was taken to the operating suite. After the induction of adequate general anesthetic, left upper extremity was prepped and draped in sterile fashion.  An Esmarch was used to exsanguinate the limb.  Tourniquet was inflated to 250 mmHg.  At this point in time, a laceration over the palmar aspect of the left long finger and the PIP flexion crease was extended distally on the radial aspect and proximally on the ulnar aspect.  Flaps were raised. Dissection was carried down the flexor sheath.  There  a complete laceration of profundus tendon and significant lacerations of both slips of the superficialis tendons.  Wound was irrigated.  The superficialis slips repaired with 4-0 Mersilene and 6-0 Prolene epitendinous type stitch.  We then carefully retrieved both the proximal and distal ends of the profundus tendon, drew them into the wound, held them with a 25- gauge needle on the proximal segment, and repaired using 3-0 double- armed Ethibond in a Tajima type suture, followed by 6-0 Prolene epitendinous type stitch.  At the end of procedure, the wound  was irrigated.  The tendon was gliding smoothly within the pulley system. The skin was closed with 4-0 nylon.  Xeroform, 4x4s, and dorsal extension block splint was applied.  The patient tolerated the procedure well in concealed fashion.    Artist PaisMatthew A. Mina MarbleWeingold, M.D.    MAW/MEDQ  D:  05/31/2014  T:  05/31/2014  Job:  [id number]

## 2014-06-04 ENCOUNTER — Encounter (HOSPITAL_BASED_OUTPATIENT_CLINIC_OR_DEPARTMENT_OTHER): Payer: Self-pay | Admitting: Orthopedic Surgery

## 2014-06-05 ENCOUNTER — Ambulatory Visit: Payer: Self-pay | Attending: Orthopedic Surgery | Admitting: Occupational Therapy

## 2014-06-05 DIAGNOSIS — M79646 Pain in unspecified finger(s): Secondary | ICD-10-CM | POA: Insufficient documentation

## 2014-06-05 DIAGNOSIS — Z5189 Encounter for other specified aftercare: Secondary | ICD-10-CM | POA: Insufficient documentation

## 2014-06-05 DIAGNOSIS — M2560 Stiffness of unspecified joint, not elsewhere classified: Secondary | ICD-10-CM | POA: Insufficient documentation

## 2014-06-10 ENCOUNTER — Encounter (HOSPITAL_BASED_OUTPATIENT_CLINIC_OR_DEPARTMENT_OTHER): Payer: Self-pay | Admitting: Orthopedic Surgery

## 2014-06-13 ENCOUNTER — Encounter: Payer: Self-pay | Admitting: Occupational Therapy

## 2014-06-13 ENCOUNTER — Ambulatory Visit: Payer: Self-pay | Attending: Orthopedic Surgery | Admitting: Occupational Therapy

## 2014-06-13 DIAGNOSIS — S66802D Unspecified injury of other specified muscles, fascia and tendons at wrist and hand level, left hand, subsequent encounter: Secondary | ICD-10-CM

## 2014-06-13 DIAGNOSIS — Z5189 Encounter for other specified aftercare: Secondary | ICD-10-CM | POA: Insufficient documentation

## 2014-06-13 DIAGNOSIS — M2560 Stiffness of unspecified joint, not elsewhere classified: Secondary | ICD-10-CM | POA: Insufficient documentation

## 2014-06-13 DIAGNOSIS — M79646 Pain in unspecified finger(s): Secondary | ICD-10-CM | POA: Insufficient documentation

## 2014-06-13 NOTE — Therapy (Signed)
Occupational Therapy Treatment  Patient Details  Name: Eric [name] MRN: [id number] Date of Birth: 04-23-1991  Encounter Date: 06/13/2014      OT End of Session - 06/13/14 0907    Visit Number 2   Date for OT Re-Evaluation 07/05/14   OT Start Time 0805   OT Stop Time 0855   OT Time Calculation (min) 50 min   OT Charge Details Splinting, ther ex   Activity Tolerance Patient tolerated treatment well      Past Medical History  Diagnosis Date  . Finger laceration involving tendon 05/30/2014    left long finger  . History of asthma     as a child    Past Surgical History  Procedure Laterality Date  . No past surgeries    . Tendon repair Left 05/31/2014    Procedure: LEFT LONG FINGER EXPLORE/NERVE, TENDON AND ARTERY AND REPAIR TENDONS;  Surgeon: Dairl PonderMatthew Weingold, MD;  Location: [facility];  Service: Orthopedics;  Laterality: Left;  . Artery exploration Left 05/31/2014    Procedure: ARTERY EXPLORATION;  Surgeon: Dairl PonderMatthew Weingold, MD;  Location: [facility];  Service: Orthopedics;  Laterality: Left;  Left long finger  . Nerve exploration Left 05/31/2014    Procedure: NERVE EXPLORATION;  Surgeon: Dairl PonderMatthew Weingold, MD;  Location: [facility];  Service: Orthopedics;  Laterality: Left;  Left long finger    There were no vitals taken for this visit.  Visit Diagnosis:  Injury of flexor tendon of hand, left, subsequent encounter          OT Treatments/Exercises (OP) - 06/13/14 0857    Splinting   Splinting Splint check and adjustments  Replaced velcro, straps & redressed due to maceration     Pt and his girlfriend were educated in keeping his hand elevated, no functional use, no AROM. Reviewed HEP for Modified Duran ex's s/p Zone II flexor tendon repair, Left MF. Pt performed all ex's in clinic x15 reps each for PROM DIP flexion, PROM PIP flexion, PROM Composite Flexion, followed by PROM for extension to top of dorsal blocking  splint.  Pt with noted maceration dorsal hand (PIP's of middle, ring and small fingers as well as ulnar hand). Pt was redressed with 2x2's, stockinette and digital stockinette. Pt/girlfriend were educated to keep hand dry, switch out stockinette and perform dressing changes daily as well as keep hand dry at all times to avoid further sking breakdown. They verbalized understanding of this.      Education - 06/13/14 0907    Education provided Yes   Education Details Patient;Other (comment)  Girlfriend   Methods Explanation;Demonstration   Comprehension Verbalized understanding          OT Short Term Goals - 06/13/14 0910    OT SHORT TERM GOAL #1   Title I with HEP   Time 4   Period Weeks  Assess STG's 07/05/14   Status On-going   OT SHORT TERM GOAL #2   Title I with splint wear and care, precautions    Time 4  Assess STG's 07/05/14   Period Weeks   Status On-going   OT SHORT TERM GOAL #3   Title Verbalize understanding of edema control and scar massage.   Time 4  Assess STG's 07/05/14   Period Weeks   Status On-going          OT Long Term Goals - 06/13/14 0913    OT LONG TERM GOAL #1   Title I w/ updated HEP  Time --  Assess LTG's 09/04/14   Period Weeks   Status On-going   OT LONG TERM GOAL #2   Title Resume use of LUE as a non dominant assist at least 90% of the time for ADL's/home management with pain less than or equal to 3/10   Time 12  Assess LTG's 09/04/14   Period Weeks   Status On-going   OT LONG TERM GOAL #3   Title Demonstrate grip strength in LUE of at least 30 # for incresaed functional use.   Time 12  Assess LTG's 09/04/14   Period Weeks   Status On-going   OT LONG TERM GOAL #4   Title Demonstrate at least 90% composite finger flexion for left middle finger.   Time 12  Assess LTG's 09/04/14   Period Weeks   Status On-going          Plan - 06/13/14 0908    Clinical Impression Statement Pt presents to clinic today for splint check and  adjustments as well as HEP.   Pt will benefit from skilled therapeutic intervention in order to improve on the following deficits Decreased coordination;Decreased range of motion;Decreased skin integrity;Impaired flexibility;Increased edema;Impaired UE functional use;Decreased mobility;Pain   Rehab Potential Good   OT Frequency Min 2X/week   OT Duration 12 weeks   OT Treatment/Interventions Therapeutic exercise;Splinting;Therapeutic activities;Patient/family education;Self-care/ADL training   OT Plan Splint check and adjustments as well as progress program as per Duran Protocol (DOS: 05/31/14).       Problem List There are no active problems to display for this patient.                                           Roselie AwkwardBarnhill, Amy Beth Dixon 06/13/2014, 9:23 AM

## 2014-06-13 NOTE — Patient Instructions (Signed)
Pt and his girlfriend were educated in keeping his hand elevated, no functional use, no AROM. Reviewed HEP for Modified Duran ex's s/p Zone II flexor tendon repair, Left MF. Pt performed all ex's in clinic x15 reps each for PROM DIP flexion, PROM PIP flexion, PROM Composite Flexion, followed by PROM for extension to top of dorsal blocking splint. Pt with noted maceration dorsal hand (PIP's of middle, ring and small fingers as well as ulnar hand). Pt was redressed with 2x2's, stockinette and digital stockinette. Pt/girlfriend were educated to keep hand dry, switch out stockinette and perform dressing changes daily as well as keep hand dry at all times to avoid further sking breakdown. They verbalized understanding of this.

## 2014-06-20 ENCOUNTER — Ambulatory Visit: Payer: Self-pay | Admitting: Occupational Therapy

## 2014-06-27 ENCOUNTER — Ambulatory Visit: Payer: Self-pay | Admitting: Occupational Therapy

## 2014-07-01 ENCOUNTER — Ambulatory Visit: Payer: Self-pay | Admitting: Occupational Therapy

## 2014-07-02 ENCOUNTER — Ambulatory Visit: Payer: Self-pay | Admitting: Occupational Therapy

## 2014-07-16 ENCOUNTER — Ambulatory Visit: Payer: Self-pay | Attending: Orthopedic Surgery | Admitting: Occupational Therapy

## 2014-07-16 ENCOUNTER — Telehealth: Payer: Self-pay | Admitting: Occupational Therapy

## 2014-07-16 DIAGNOSIS — M2560 Stiffness of unspecified joint, not elsewhere classified: Secondary | ICD-10-CM | POA: Insufficient documentation

## 2014-07-16 DIAGNOSIS — M79646 Pain in unspecified finger(s): Secondary | ICD-10-CM | POA: Insufficient documentation

## 2014-07-16 DIAGNOSIS — Z5189 Encounter for other specified aftercare: Secondary | ICD-10-CM | POA: Insufficient documentation

## 2014-07-16 NOTE — Telephone Encounter (Signed)
Therapist called pt to let him know that his remaining appointments would be cancelled due to multiple no shows. Keene BreathKathryn Rine, OTR/L [phone] Phone: [phone] 9:57 AM 07/16/2014

## 2014-07-18 ENCOUNTER — Encounter: Payer: Self-pay | Admitting: Occupational Therapy

## 2014-07-23 ENCOUNTER — Encounter: Payer: Self-pay | Admitting: Occupational Therapy

## 2014-07-25 ENCOUNTER — Encounter: Payer: Self-pay | Admitting: Occupational Therapy

## 2014-07-29 ENCOUNTER — Encounter: Payer: Self-pay | Admitting: Occupational Therapy

## 2014-07-29 NOTE — Therapy (Signed)
[facility] [address] [place], [place], [address] Phone: [phone]   Fax:  [phone]  Patient Details  Name: Eric [name] MRN: [id number] Date of Birth: 03-18-1991  Encounter Date: 07/29/2014    Pt did not meet the following goals as he did not return to therapy.   1.I with updated HEP 2.  Resume use of LUE as a non dominant assist at least 90% of the time for ADL's/home management with pain less than or  3/10 3. Demonstrate grip strength LUE of at least 30 lbs for increased functional use. 4.Demonstrate at least 90% composite finger flexion for left middle finger   OCCUPATIONAL THERAPY DISCHARGE SUMMARY  Visits from Start of Care: 2  Current functional level related to goals / functional outcomes: Pt did not meet goals as he was only seen for 2 visits then did not return.  Remaining deficits: Pain, decreased ROM, decreased strength   Education / Equipment: Pt was educated regarding initial HEP, splint wear, care and precautions. Pt education was not completed as pt did not return to therapy. Plan: Patient agrees to discharge.  Patient goals were not met. Patient is being discharged due to lack of progress.  ?????          Theone Murdoch, OTR/L [phone] Phone: [phone] 12:34 PM 07/29/2014  RINE,KATHRYN 07/29/2014, 12:25 PM  [facility] [address] [place], [place], [address] Phone: [phone]   Fax:  [phone]

## 2016-04-01 IMAGING — CR DG FINGER MIDDLE 2+V*L*
3 series · 3 of 3 positions shown · non-contrast
Comparison: None.

CLINICAL DATA: Laceration to the palm are side of the left middle
finger at the PIP. Cut while doing dishes.

EXAM:
LEFT MIDDLE FINGER 2+V

[x finger pa left]
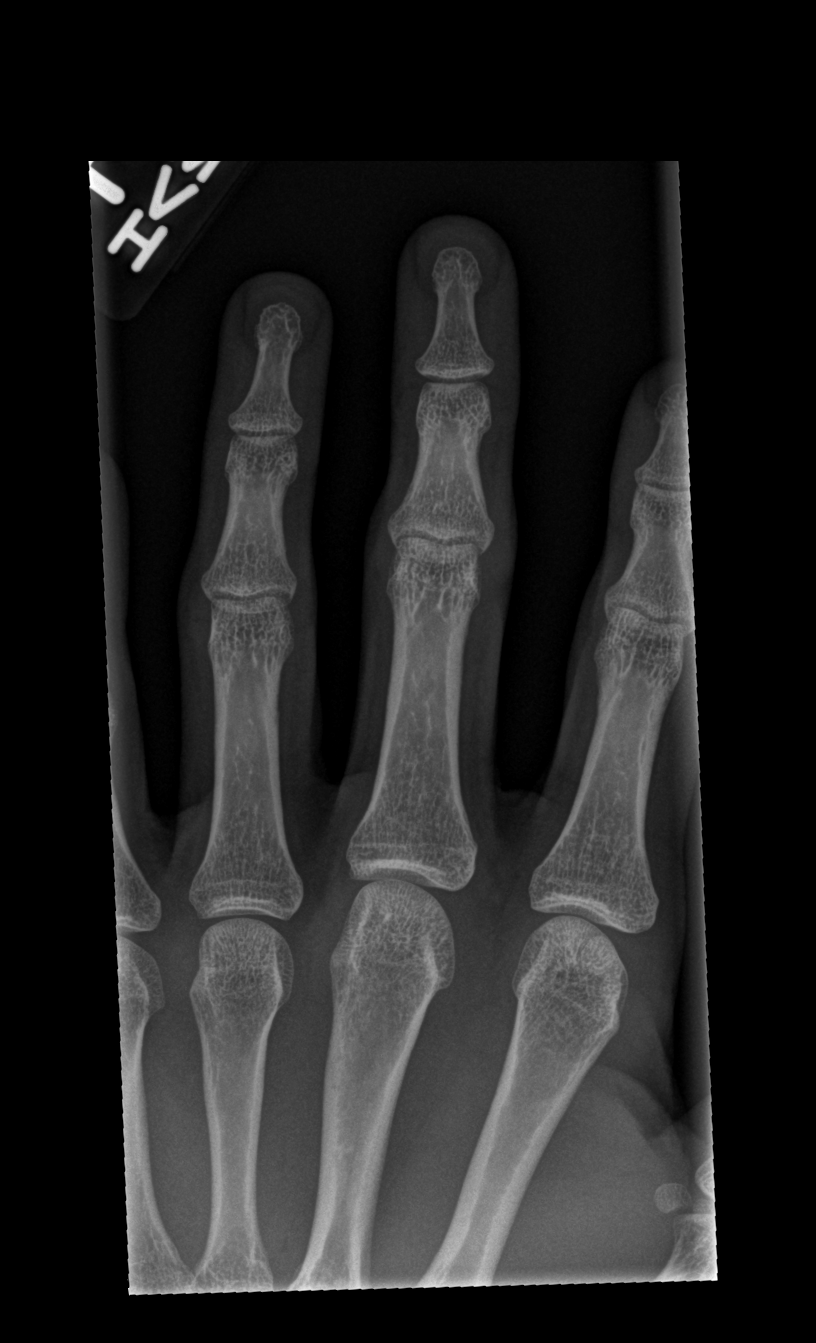

[x finger obl left]
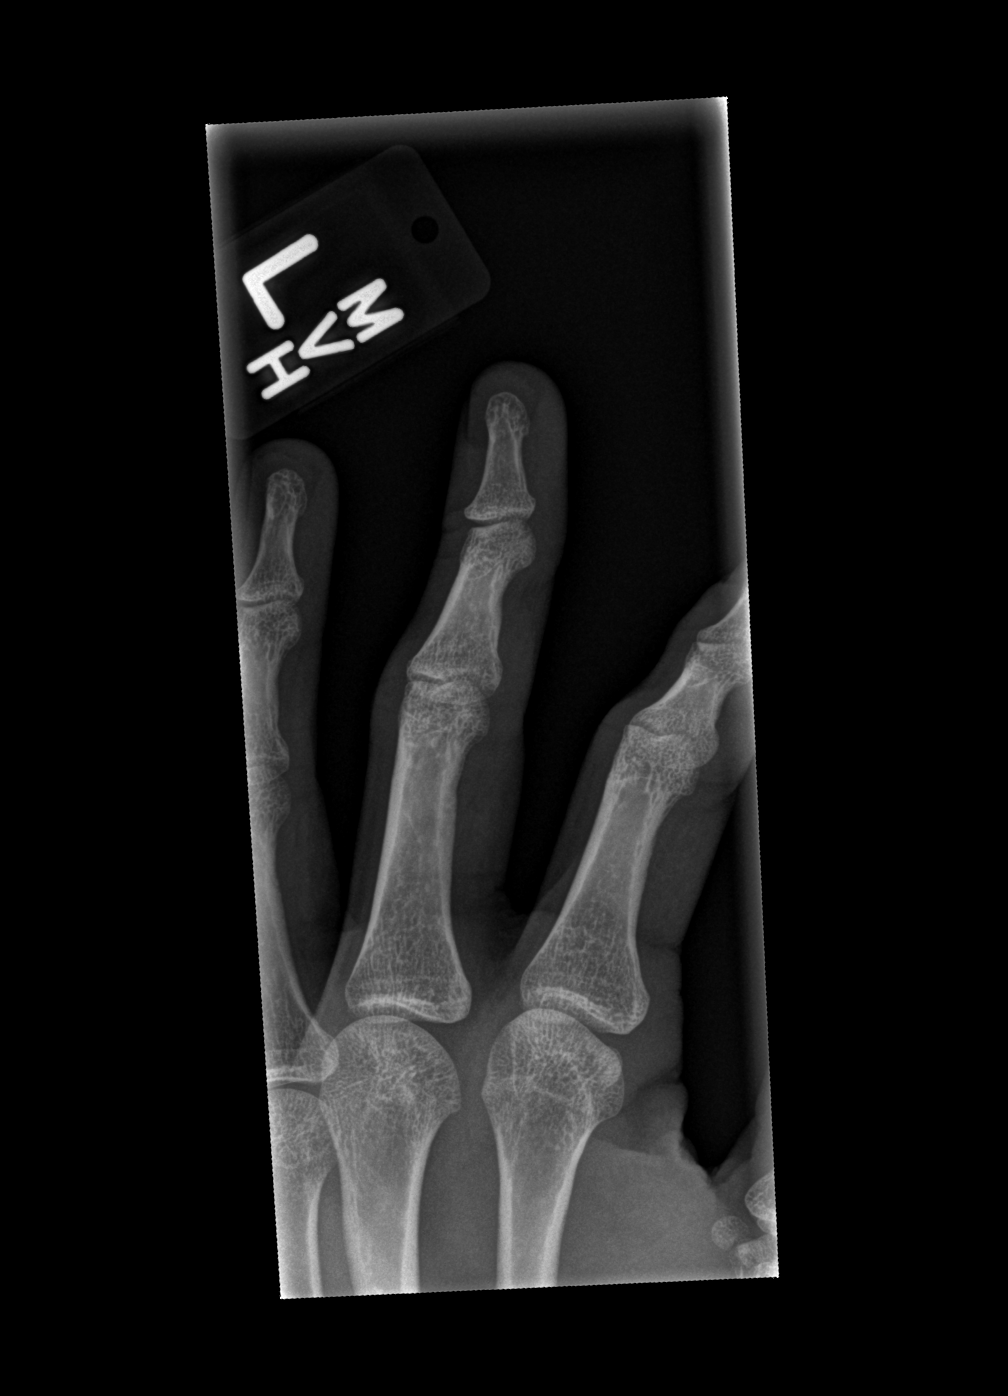

[x finger lat left]
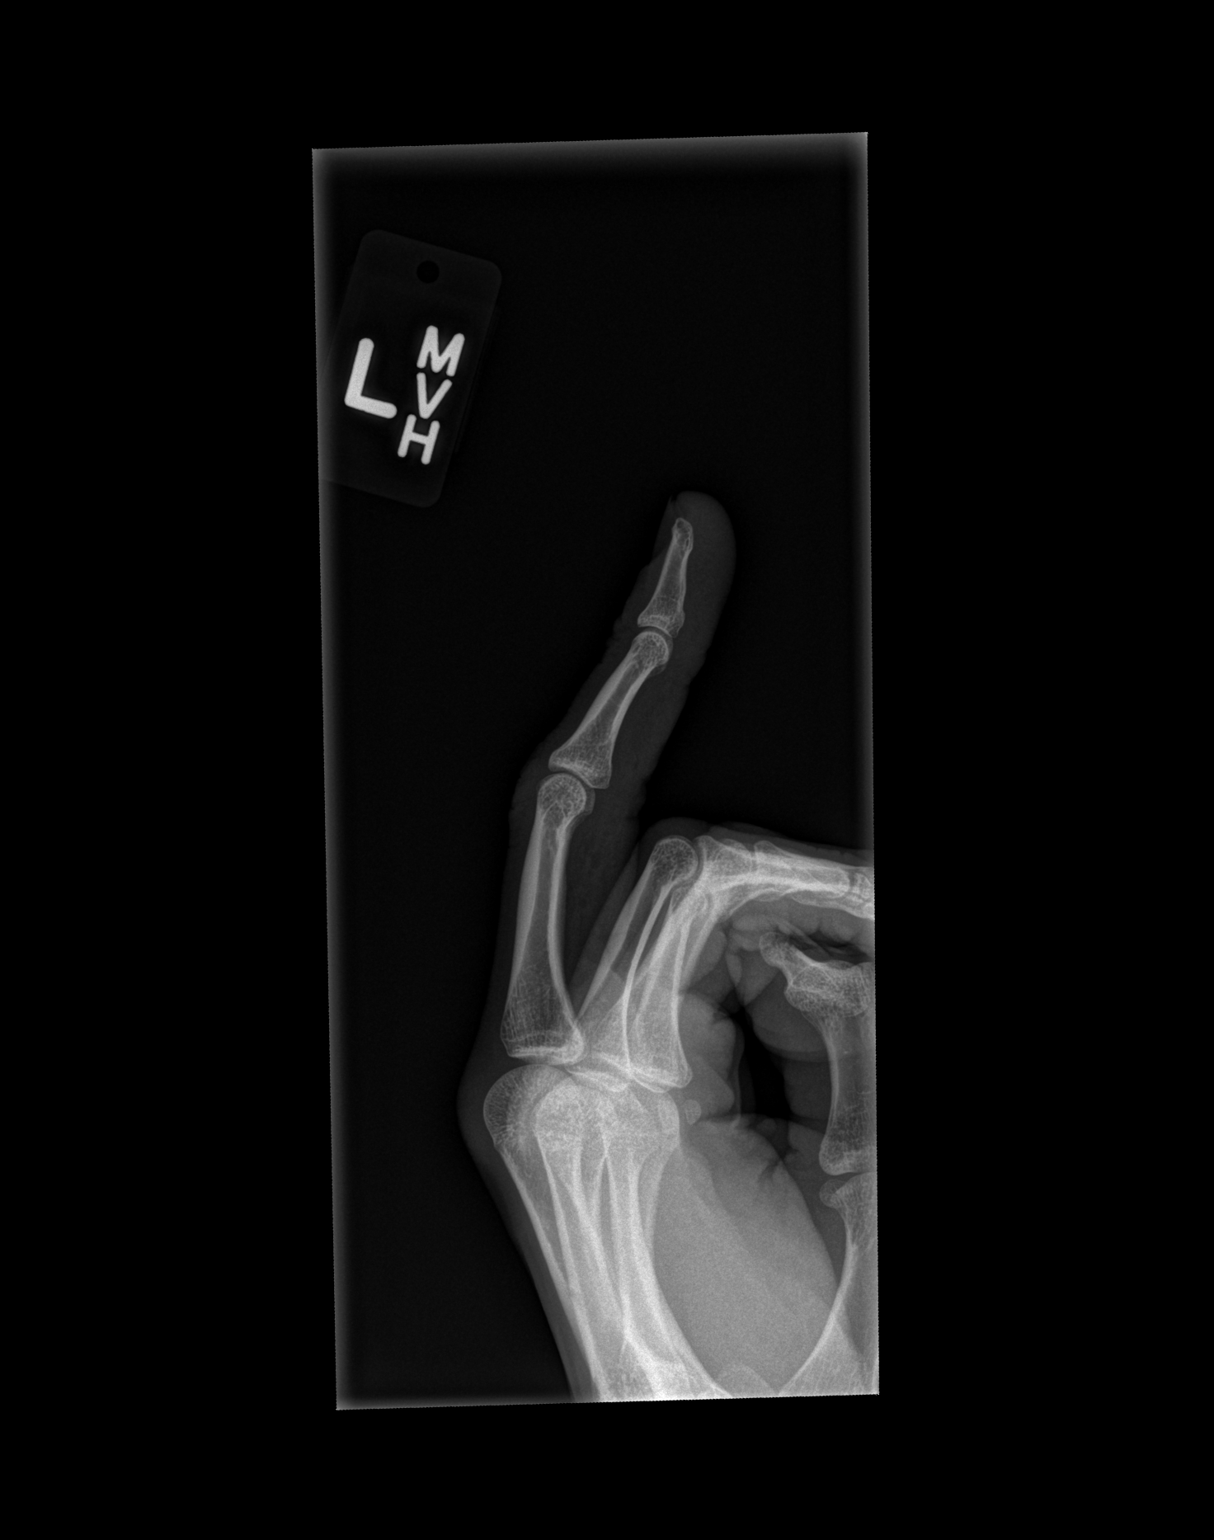

[3 of 3 positions shown; findings below may reference images not displayed]

FINDINGS: No evidence of acute fracture or dislocation of the left third
finger. No focal bone lesions or bone destruction. No radiopaque
soft tissue foreign bodies.
IMPRESSION: Negative.
